# Patient Record
Sex: Male | Born: 1950 | Race: White | Hispanic: No | Marital: Single | Smoking: Never smoker
Health system: Southern US, Community
[De-identification: ages and names within clinical notes are randomized; demographics above are authoritative.]

## PROBLEM LIST (undated history)

## (undated) DIAGNOSIS — I2510 Atherosclerotic heart disease of native coronary artery without angina pectoris: Secondary | ICD-10-CM

## (undated) DIAGNOSIS — N200 Calculus of kidney: Secondary | ICD-10-CM

---

## 2017-08-18 ENCOUNTER — Emergency Department (HOSPITAL_COMMUNITY)
Admission: EM | Admit: 2017-08-18 | Discharge: 2017-08-18 | Disposition: A | Discharge status: Home / Self Care | Payer: Medicare HMO | Attending: Physician Assistant | Admitting: Physician Assistant

## 2017-08-18 ENCOUNTER — Emergency Department (HOSPITAL_COMMUNITY): Payer: Medicare HMO

## 2017-08-18 ENCOUNTER — Encounter (HOSPITAL_COMMUNITY): Payer: Self-pay

## 2017-08-18 DIAGNOSIS — R1032 Left lower quadrant pain: Secondary | ICD-10-CM | POA: Diagnosis present

## 2017-08-18 DIAGNOSIS — Z7982 Long term (current) use of aspirin: Secondary | ICD-10-CM | POA: Diagnosis not present

## 2017-08-18 DIAGNOSIS — I2510 Atherosclerotic heart disease of native coronary artery without angina pectoris: Secondary | ICD-10-CM | POA: Diagnosis not present

## 2017-08-18 DIAGNOSIS — N200 Calculus of kidney: Secondary | ICD-10-CM | POA: Diagnosis not present

## 2017-08-18 DIAGNOSIS — Z79899 Other long term (current) drug therapy: Secondary | ICD-10-CM | POA: Insufficient documentation

## 2017-08-18 HISTORY — DX: Atherosclerotic heart disease of native coronary artery without angina pectoris: I25.10

## 2017-08-18 HISTORY — DX: Calculus of kidney: N20.0

## 2017-08-18 LAB — COMPREHENSIVE METABOLIC PANEL
ALBUMIN: 3.9 g/dL (ref 3.5–5.0)
ALK PHOS: 79 U/L (ref 38–126)
ALT: 49 U/L (ref 17–63)
AST: 30 U/L (ref 15–41)
Anion gap: 7 (ref 5–15)
BILIRUBIN TOTAL: 1.1 mg/dL (ref 0.3–1.2)
BUN: 12 mg/dL (ref 6–20)
CALCIUM: 9.4 mg/dL (ref 8.9–10.3)
CO2: 26 mmol/L (ref 22–32)
CREATININE: 1.78 mg/dL — AB (ref 0.61–1.24)
Chloride: 105 mmol/L (ref 101–111)
GFR calc Af Amer: 44 mL/min — ABNORMAL LOW (ref >60)
GFR calc non Af Amer: 38 mL/min — ABNORMAL LOW (ref >60)
GLUCOSE: 95 mg/dL (ref 65–99)
Potassium: 4 mmol/L (ref 3.5–5.1)
Sodium: 138 mmol/L (ref 135–145)
TOTAL PROTEIN: 6.8 g/dL (ref 6.5–8.1)

## 2017-08-18 LAB — URINALYSIS, ROUTINE W REFLEX MICROSCOPIC
Bilirubin Urine: NEGATIVE
Glucose, UA: NEGATIVE mg/dL
Hgb urine dipstick: NEGATIVE
Ketones, ur: NEGATIVE mg/dL
Leukocytes, UA: NEGATIVE
Nitrite: NEGATIVE
PH: 5 (ref 5.0–8.0)
Protein, ur: NEGATIVE mg/dL
SPECIFIC GRAVITY, URINE: 1.014 (ref 1.005–1.030)

## 2017-08-18 LAB — CBC WITH DIFFERENTIAL/PLATELET
BASOS ABS: 0 10*3/uL (ref 0.0–0.1)
BASOS PCT: 0 %
EOS PCT: 0 %
Eosinophils Absolute: 0 10*3/uL (ref 0.0–0.7)
HCT: 49.2 % (ref 39.0–52.0)
Hemoglobin: 16.5 g/dL (ref 13.0–17.0)
LYMPHS PCT: 8 %
Lymphs Abs: 0.9 10*3/uL (ref 0.7–4.0)
MCH: 30.2 pg (ref 26.0–34.0)
MCHC: 33.5 g/dL (ref 30.0–36.0)
MCV: 89.9 fL (ref 78.0–100.0)
MONO ABS: 1.1 10*3/uL — AB (ref 0.1–1.0)
Monocytes Relative: 9 %
Neutro Abs: 10.1 10*3/uL — ABNORMAL HIGH (ref 1.7–7.7)
Neutrophils Relative %: 83 %
PLATELETS: 183 10*3/uL (ref 150–400)
RBC: 5.47 MIL/uL (ref 4.22–5.81)
RDW: 13 % (ref 11.5–15.5)
WBC: 12.2 10*3/uL — ABNORMAL HIGH (ref 4.0–10.5)

## 2017-08-18 MED ORDER — TAMSULOSIN HCL 0.4 MG PO CAPS
0.4000 mg | ORAL_CAPSULE | Freq: Every day | ORAL | 0 refills | Status: AC
Start: 2017-08-18 — End: ?

## 2017-08-18 MED ORDER — SODIUM CHLORIDE 0.9 % IV BOLUS (SEPSIS)
1000.0000 mL | Freq: Once | INTRAVENOUS | Status: AC
  Administered 2017-08-18: 1000 mL via INTRAVENOUS

## 2017-08-18 MED ORDER — ONDANSETRON 4 MG PO TBDP: 4.0000 mg | ORAL_TABLET | Freq: Three times a day (TID) | ORAL | 0 refills | Status: AC | PRN

## 2017-08-18 MED ORDER — ONDANSETRON HCL 4 MG/2ML IJ SOLN
4.0000 mg | Freq: Once | INTRAMUSCULAR | Status: AC
  Administered 2017-08-18: 4 mg via INTRAVENOUS
  Filled 2017-08-18: qty 2

## 2017-08-18 MED ORDER — IOPAMIDOL (ISOVUE-300) INJECTION 61%
INTRAVENOUS | Status: AC
  Filled 2017-08-18: qty 75

## 2017-08-18 MED ORDER — KETOROLAC TROMETHAMINE 30 MG/ML IJ SOLN
30.0000 mg | Freq: Once | INTRAMUSCULAR | Status: AC
  Administered 2017-08-18: 30 mg via INTRAVENOUS
  Filled 2017-08-18: qty 1

## 2017-08-18 MED ORDER — OXYCODONE-ACETAMINOPHEN 5-325 MG PO TABS: 1.0000 | ORAL_TABLET | Freq: Four times a day (QID) | ORAL | 0 refills | Status: AC | PRN

## 2017-08-18 NOTE — ED Notes (Signed)
Pt comfortable pt denies the need for pain meds.

## 2017-08-18 NOTE — ED Provider Notes (Signed)
[facility] DEPT Provider Note   CSN: [id number] Arrival date & time: 08/18/17  7341     History   Chief Complaint No chief complaint on file.   HPI Koichi Platte. is a 66 y.o. male.  HPI   66 year old male presenting with left flank pain radiating to his groin. Patient has history of kidney stones. Had a lithotripsy in 1993. Patient had additional stones past 2015. Patient presented pain started on Saturday. He reports the pain fluctuates. He's had no fevers nausea vomiting. Patient has chronic difficulty urinating, no change today.    Past Medical History:  Diagnosis Date  . Coronary artery disease   . Kidney stones     There are no active problems to display for this patient.   History reviewed. No pertinent surgical history.     Home Medications    Prior to Admission medications   Medication Sig Start Date End Date Taking? Authorizing Provider  aspirin 325 MG tablet Take 325 mg by mouth daily.   Yes [provider]  atorvastatin (LIPITOR) 40 MG tablet Take 40 mg by mouth daily.   Yes [provider]  cyclobenzaprine (FLEXERIL) 10 MG tablet Take 10 mg by mouth daily as needed for muscle spasms.  08/09/17  Yes [provider]  losartan (COZAAR) 25 MG tablet Take 25 mg by mouth daily. 06/14/17  Yes [provider]  metoprolol succinate (TOPROL-XL) 50 MG 24 hr tablet Take 50 mg by mouth daily. 05/29/17  Yes [provider]    Family History No family history on file.  Social History Social History  Substance Use Topics  . Smoking status: Never Smoker  . Smokeless tobacco: Never Used  . Alcohol use Not on file     Allergies   Keflet [cephalexin]   Review of Systems Review of Systems  Constitutional: Negative for activity change.  Respiratory: Negative for shortness of breath.   Cardiovascular: Negative for chest pain.  Gastrointestinal: Negative for abdominal pain.  Genitourinary: Positive for difficulty  urinating and flank pain. Negative for dysuria.     Physical Exam Updated Vital Signs BP (!) 130/106 (BP Location: Right Arm)   Pulse 70   Temp 98.5 F (36.9 C) (Oral)   Resp 20   SpO2 96%   Physical Exam  Constitutional: He is oriented to person, place, and time. He appears well-nourished.  HENT:  Head: Normocephalic.  Eyes: Conjunctivae are normal.  Cardiovascular: Normal rate.   Pulmonary/Chest: Effort normal. No respiratory distress.  Abdominal: Soft. Bowel sounds are normal. He exhibits no distension. There is no tenderness.  Neurological: He is oriented to person, place, and time.  Skin: Skin is warm and dry. He is not diaphoretic.  Psychiatric: He has a normal mood and affect. His behavior is normal.     ED Treatments / Results  Labs (all labs ordered are listed, but only abnormal results are displayed) Labs Reviewed  COMPREHENSIVE METABOLIC PANEL - Abnormal; Notable for the following:       Result Value   Creatinine, Ser 1.78 (*)    GFR calc non Af Amer 38 (*)    GFR calc Af Amer 44 (*)    All other components within normal limits  CBC WITH DIFFERENTIAL/PLATELET - Abnormal; Notable for the following:    WBC 12.2 (*)    Neutro Abs 10.1 (*)    Monocytes Absolute 1.1 (*)    All other components within normal limits  URINALYSIS, ROUTINE W REFLEX MICROSCOPIC  EKG  EKG Interpretation None       Radiology Ct Renal Stone Study  Result Date: 08/18/2017 CLINICAL DATA:  66 year old male with left flank and groin pain EXAM: CT ABDOMEN AND PELVIS WITHOUT CONTRAST TECHNIQUE: Multidetector CT imaging of the abdomen and pelvis was performed following the standard protocol without IV contrast. COMPARISON:  None. FINDINGS: Lower chest: The lung bases are clear. Visualized cardiac structures are within normal limits for size. Sub endocardial hypoattenuation along the inferior and septal walls of the left ventricle consistent with fibrofatty remodeling in the setting of  prior myocardial infarction. No pericardial effusion. Unremarkable visualized distal thoracic esophagus. Hepatobiliary: Circumscribed low-attenuation lesion in the hepatic dome measures 1.1 cm in demonstrates internal water attenuation most consistent with simple cysts. A second intermediate attenuation lesion in hepatic segment 6/5 measures 1.9 cm in diameter and is incompletely evaluated in the absence of intravenous contrast. Gallbladder is unremarkable. No intra or extrahepatic biliary ductal dilatation. Pancreas: Unremarkable. No pancreatic ductal dilatation or surrounding inflammatory changes. Spleen: Normal in size without focal abnormality. Adrenals/Urinary Tract: Normal adrenal glands. Nonspecific perinephric stranding bilaterally. Mild fullness of the left renal collecting system consistent with mild hydronephrosis. On the coronal reformatted images, there is a suggestion of a tiny calculus within the mid left ureter. Atypical configuration of the right renal pelvis with a 4.3 x 4.0 cm cystic structure centrally. This could represent a parapelvic renal cyst versus dilatation of the renal pelvis from UPJ obstruction. A large parapelvic renal sinus cyst is favored. Stomach/Bowel: No focal bowel wall thickening or evidence obstruction. Colonic diverticular disease without CT evidence of active inflammation. Vascular/Lymphatic: Limited evaluation in the absence of intravenous contrast. No aneurysm. Scattered atherosclerotic vascular calcifications including along the abdominal aorta. Reproductive: Prostate is unremarkable. Other: Small fat containing umbilical hernia. Musculoskeletal: No acute fracture or malalignment. Compression deformity of the T12 vertebral body with approximately 50% height loss anteriorly. No evidence of acute fracture line. Mild L5-S1 degenerative disc disease. IMPRESSION: 1. Mild left-sided hydronephrosis and associated perinephric stranding. There is a suggestion of a possible tiny  punctate calcification within the left mid to distal ureter which may represent a passing stone. 2. 4.3 cm low-attenuation cystic structure in the region of the right renal pelvis is favored to represent a benign parapelvic renal sinus cysts. However, this structure is incompletely evaluated in the absence of intravenous contrast and a focally dilated renal pelvis in the setting of the UPJ obstruction is difficult to exclude entirely. Recommend further evaluation with CT scan of the abdomen with intravenous contrast (renal protocol). 3. Indeterminate 1.9 cm low-attenuation lesion in hepatic segment 5/6 likely represents a benign or minimally complex cyst, but is also incompletely evaluated in the absence of intravenous contrast. This lesion could also be further evaluated on the contrasted CT scan recommended above. 4. Sub endocardial fibrofatty remodeling along the left ventricular septal and inferior wall consistent with prior myocardial infarction. 5.  Aortic Atherosclerosis (ICD10-170.0) 6. Remote T12 compression fracture with approximately 50% height loss. Electronically Signed   By: Jacqulynn Cadet M.D.   On: 08/18/2017 10:34    Procedures Procedures (including critical care time)  Medications Ordered in ED Medications  sodium chloride 0.9 % bolus 1,000 mL (1,000 mLs Intravenous New Bag/Given 08/18/17 0936)  ketorolac (TORADOL) 30 MG/ML injection 30 mg (30 mg Intravenous Given 08/18/17 0936)  ondansetron (ZOFRAN) injection 4 mg (4 mg Intravenous Given 08/18/17 0936)     Initial Impression / Assessment and Plan / ED Course  I  have reviewed the triage vital signs and the nursing notes.  Pertinent labs & imaging results that were available during my care of the patient were reviewed by me and considered in my medical decision making (see chart for details).    66 year old male presenting with left flank pain radiating to his groin. Patient has history of kidney stones. Had a lithotripsy in 1993.  Patient had additional stones past 2015. Patient presented pain started on Saturday. He reports the pain fluctuates. He's had no fevers nausea vomiting. Patient has chronic difficulty urinating, no change today.  11:11 AM Suspect the patient has kidney stone. However urine shows no blood in the urine Given his age, and other risk factors, we'll do CT stone to confirm and get an idea of size in case of need of lithotripsy.  11:11 AM CT shows tiny calculus on the left with hydro., Cr at 1.78 and findings on the right. Disucssed wth [facility] who recommends usual stone treatement and they will see him this week for repeat labs, and evaluation of R kindey as outpatient.   Final Clinical Impressions(s) / ED Diagnoses   Final diagnoses:  None    New Prescriptions New Prescriptions   No medications on file     Macarthur Critchley, MD 08/18/17 1111

## 2017-08-18 NOTE — Discharge Instructions (Signed)
Please follow-up on Monday for repeat labs and potentially repeat imaging this week with [facility].

## 2017-08-18 NOTE — ED Triage Notes (Signed)
Patient complains of left flank and groin pain since early am yesterday. Reports its another kidney stone, hx of same. No vomiting

## 2019-05-02 IMAGING — CT CT RENAL STONE PROTOCOL
2 of 4 series · 15 of 46 positions shown, 17 images · non-contrast
Comparison: None.

CLINICAL DATA: 66-year-old male with left flank and groin pain

EXAM:
CT ABDOMEN AND PELVIS WITHOUT CONTRAST
TECHNIQUE: Multidetector CT imaging of the abdomen and pelvis was performed
following the standard protocol without IV contrast.

[Series 3: ap without · axial · non-contrast · 0.77mm/px · z∈[+908,+1314]mm · 12 of 91 slices shown, 14 images]
[im 5/91  soft-tissue]
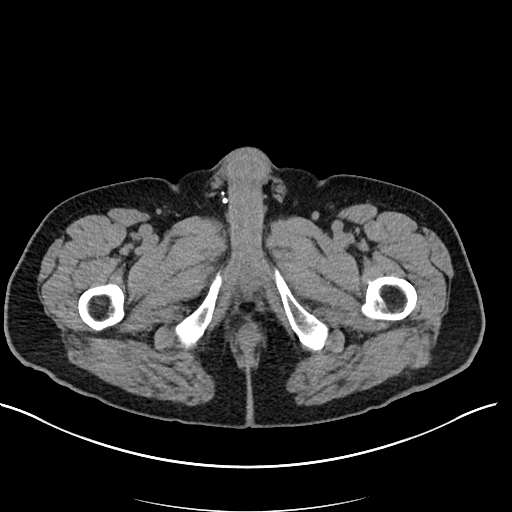
[im 5/91  bone]
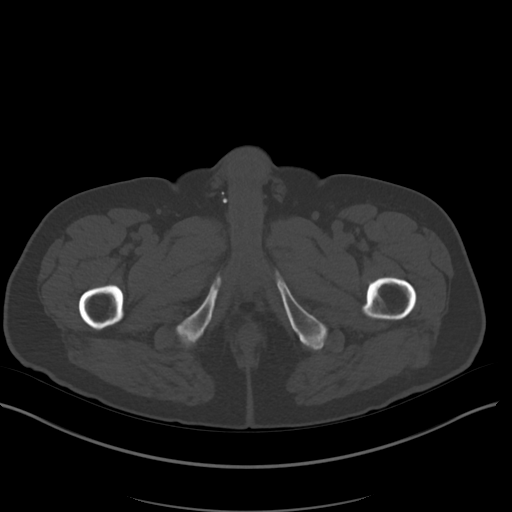
[im 13/91  soft-tissue]
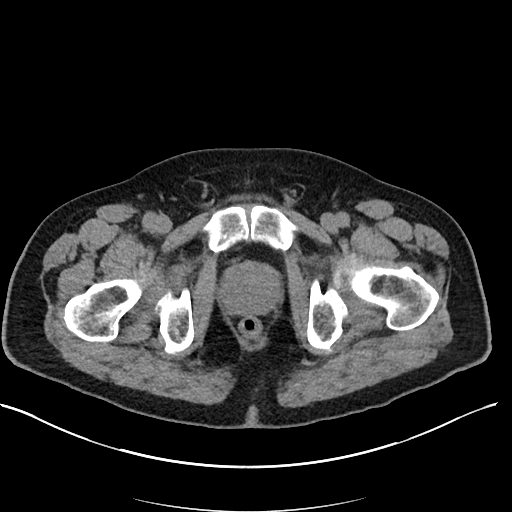
[im 22/91  soft-tissue]
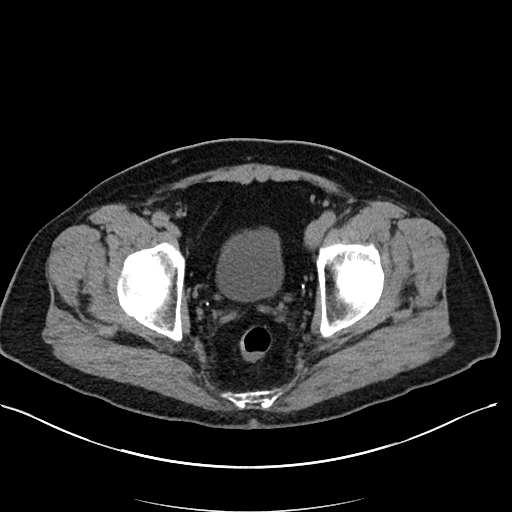
[im 26/91  soft-tissue]
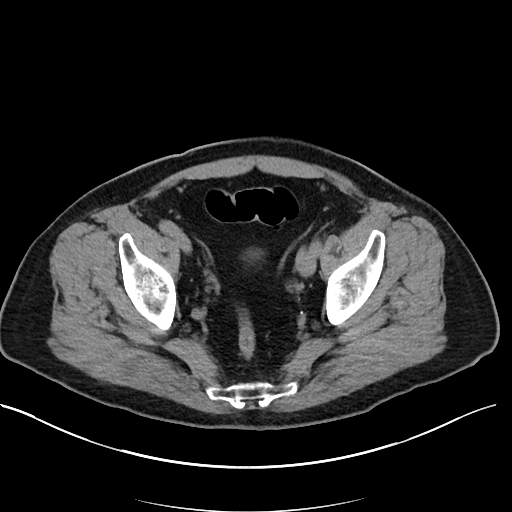
[im 35/91  soft-tissue]
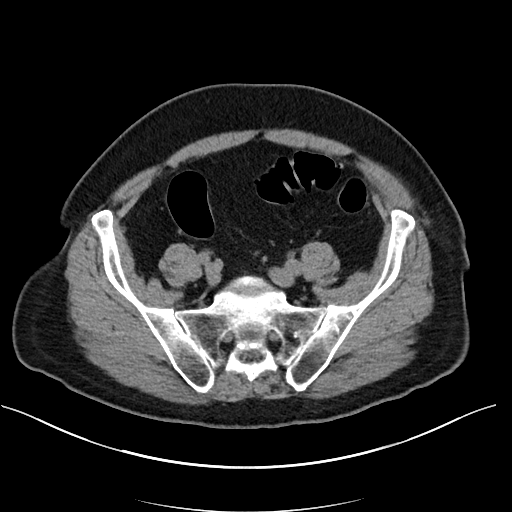
[im 43/91  soft-tissue]
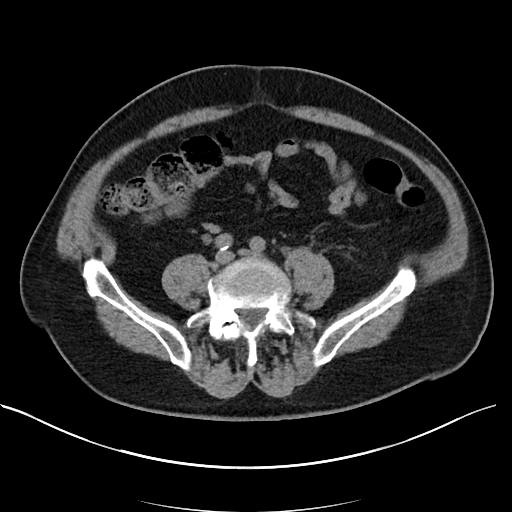
[im 48/91  soft-tissue]
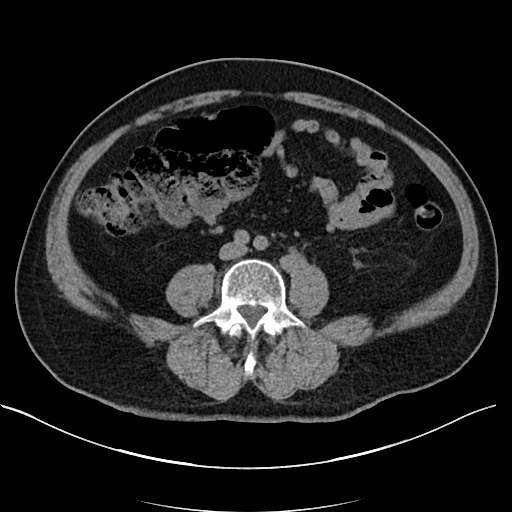
[im 56/91  soft-tissue]
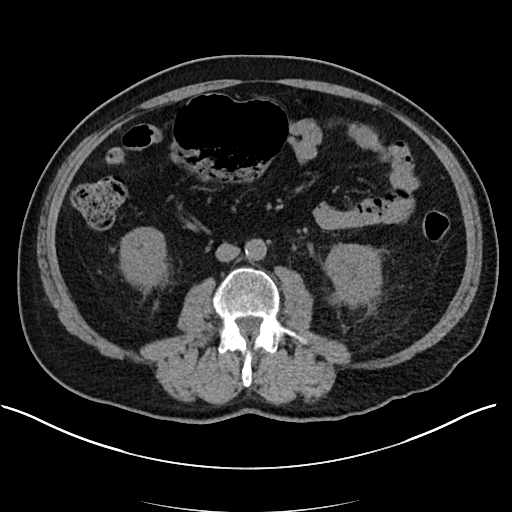
[im 65/91  soft-tissue]
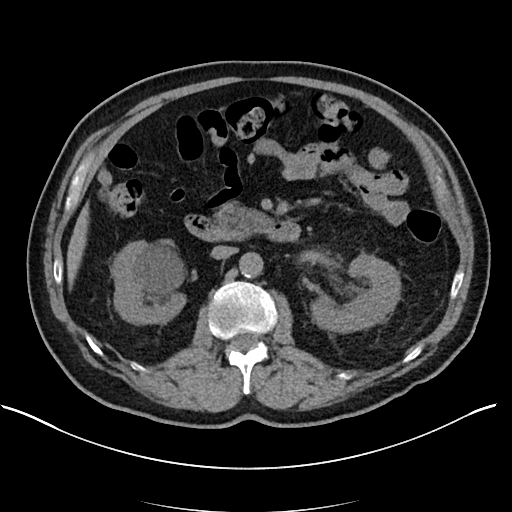
[im 65/91  bone]
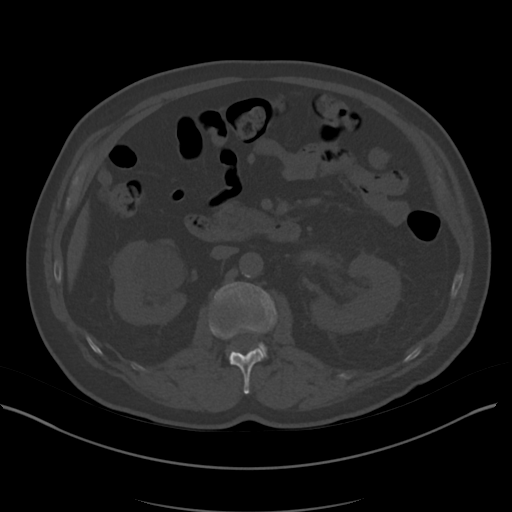
[im 69/91  soft-tissue]
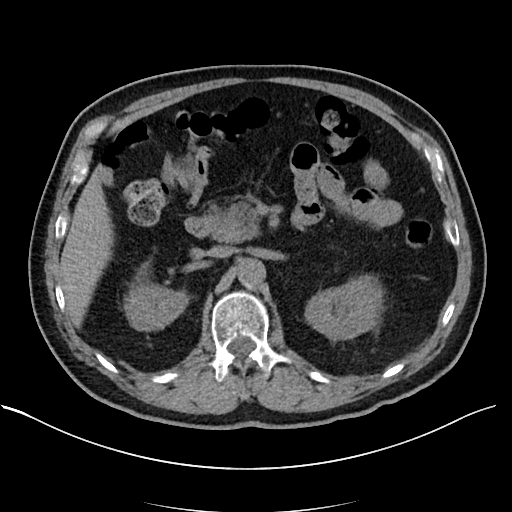
[im 78/91  soft-tissue]
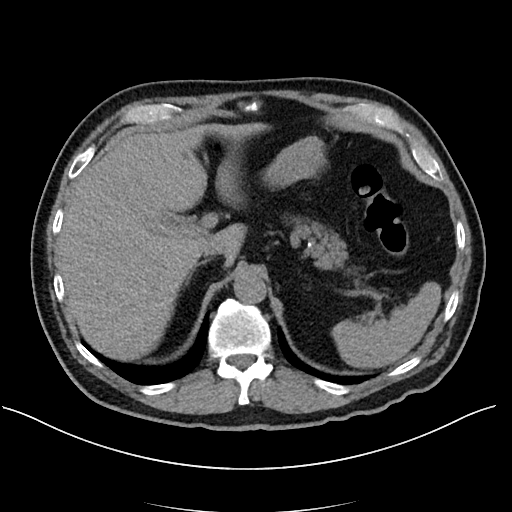
[im 86/91  soft-tissue]
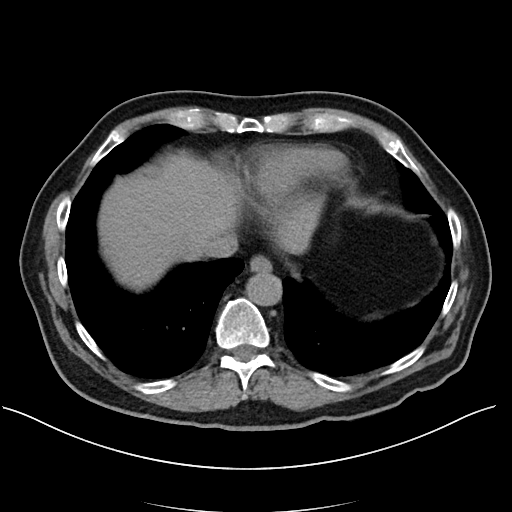

[Series 6: cor · coronal · 0.82mm/px · 3 of 119 slices shown]
[im 40/119  soft-tissue]
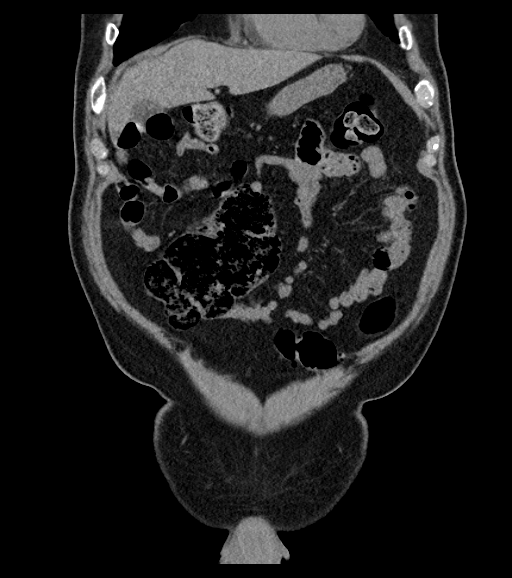
[im 53/119  soft-tissue]
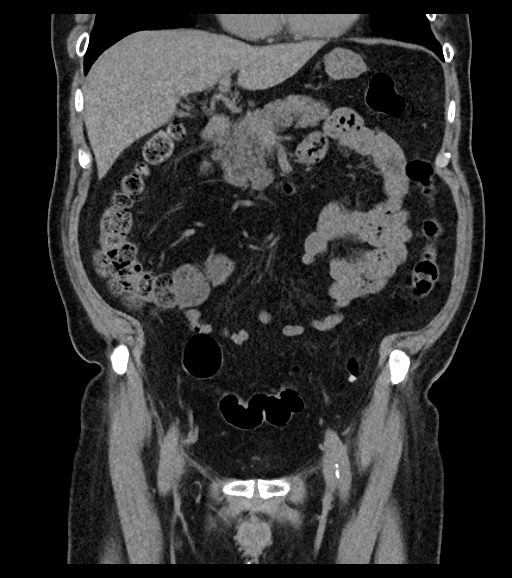
[im 66/119  soft-tissue]
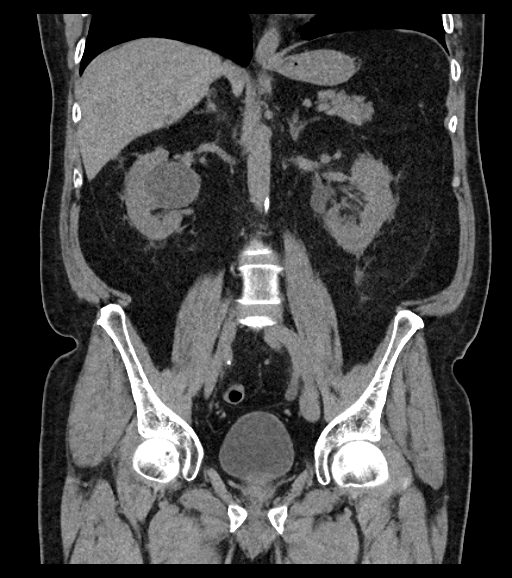

[15 of 46 positions shown; findings below may reference images not displayed]

FINDINGS: Lower chest: The lung bases are clear. Visualized cardiac structures
are within normal limits for size. Sub endocardial hypoattenuation
along the inferior and septal walls of the left ventricle consistent
with fibrofatty remodeling in the setting of prior myocardial
infarction. No pericardial effusion. Unremarkable visualized distal
thoracic esophagus.

Hepatobiliary: Circumscribed low-attenuation lesion in the hepatic
dome measures 1.1 cm in demonstrates internal water attenuation most
consistent with simple cysts. A second intermediate attenuation
lesion in hepatic segment [DATE] measures 1.9 cm in diameter and is
incompletely evaluated in the absence of intravenous contrast.
Gallbladder is unremarkable. No intra or extrahepatic biliary ductal
dilatation.

Pancreas: Unremarkable. No pancreatic ductal dilatation or
surrounding inflammatory changes.

Spleen: Normal in size without focal abnormality.

Adrenals/Urinary Tract: Normal adrenal glands. Nonspecific
perinephric stranding bilaterally. Mild fullness of the left renal
collecting system consistent with mild hydronephrosis. On the
coronal reformatted images, there is a suggestion of a tiny calculus
within the mid left ureter. Atypical configuration of the right
renal pelvis with a 4.3 x 4.0 cm cystic structure centrally. This
could represent a parapelvic renal cyst versus dilatation of the
renal pelvis from UPJ obstruction. A large parapelvic renal sinus
cyst is favored.

Stomach/Bowel: No focal bowel wall thickening or evidence
obstruction. Colonic diverticular disease without CT evidence of
active inflammation.

Vascular/Lymphatic: Limited evaluation in the absence of intravenous
contrast. No aneurysm. Scattered atherosclerotic vascular
calcifications including along the abdominal aorta.

Reproductive: Prostate is unremarkable.

Other: Small fat containing umbilical hernia.

Musculoskeletal: No acute fracture or malalignment. Compression
deformity of the T12 vertebral body with approximately 50% height
loss anteriorly. No evidence of acute fracture line. Mild L5-S1
degenerative disc disease.
IMPRESSION: 1. Mild left-sided hydronephrosis and associated perinephric
stranding. There is a suggestion of a possible tiny punctate
calcification within the left mid to distal ureter which may
represent a passing stone.
2. 4.3 cm low-attenuation cystic structure in the region of the
right renal pelvis is favored to represent a benign parapelvic renal
sinus cysts. However, this structure is incompletely evaluated in
the absence of intravenous contrast and a focally dilated renal
pelvis in the setting of the UPJ obstruction is difficult to exclude
entirely. Recommend further evaluation with CT scan of the abdomen
with intravenous contrast (renal protocol).
3. Indeterminate 1.9 cm low-attenuation lesion in hepatic segment
[DATE] likely represents a benign or minimally complex cyst, but is
also incompletely evaluated in the absence of intravenous contrast.
This lesion could also be further evaluated on the contrasted CT
scan recommended above.
4. Sub endocardial fibrofatty remodeling along the left ventricular
septal and inferior wall consistent with prior myocardial
infarction.
5.  Aortic Atherosclerosis (IR6EP-170.0)
6. Remote T12 compression fracture with approximately 50% height
loss.

## 2019-12-28 ENCOUNTER — Other Ambulatory Visit: Payer: Medicare HMO

## 2020-04-09 ENCOUNTER — Emergency Department (HOSPITAL_COMMUNITY): Payer: Medicare HMO

## 2020-04-09 ENCOUNTER — Other Ambulatory Visit: Payer: Self-pay

## 2020-04-09 ENCOUNTER — Emergency Department (HOSPITAL_COMMUNITY)
Admission: EM | Admit: 2020-04-09 | Discharge: 2020-04-09 | Disposition: A | Discharge status: Home / Self Care | Payer: Medicare HMO | Attending: Emergency Medicine | Admitting: Emergency Medicine

## 2020-04-09 ENCOUNTER — Encounter (HOSPITAL_COMMUNITY): Payer: Self-pay | Admitting: Emergency Medicine

## 2020-04-09 DIAGNOSIS — N281 Cyst of kidney, acquired: Secondary | ICD-10-CM | POA: Insufficient documentation

## 2020-04-09 DIAGNOSIS — K7689 Other specified diseases of liver: Secondary | ICD-10-CM | POA: Diagnosis not present

## 2020-04-09 DIAGNOSIS — I2510 Atherosclerotic heart disease of native coronary artery without angina pectoris: Secondary | ICD-10-CM | POA: Insufficient documentation

## 2020-04-09 DIAGNOSIS — Z79899 Other long term (current) drug therapy: Secondary | ICD-10-CM | POA: Diagnosis not present

## 2020-04-09 DIAGNOSIS — K5730 Diverticulosis of large intestine without perforation or abscess without bleeding: Secondary | ICD-10-CM | POA: Insufficient documentation

## 2020-04-09 DIAGNOSIS — I700 Atherosclerosis of aorta: Secondary | ICD-10-CM | POA: Insufficient documentation

## 2020-04-09 DIAGNOSIS — N130 Hydronephrosis with ureteropelvic junction obstruction: Secondary | ICD-10-CM | POA: Diagnosis not present

## 2020-04-09 DIAGNOSIS — R110 Nausea: Secondary | ICD-10-CM

## 2020-04-09 DIAGNOSIS — M4854XA Collapsed vertebra, not elsewhere classified, thoracic region, initial encounter for fracture: Secondary | ICD-10-CM | POA: Diagnosis not present

## 2020-04-09 DIAGNOSIS — Z955 Presence of coronary angioplasty implant and graft: Secondary | ICD-10-CM | POA: Insufficient documentation

## 2020-04-09 DIAGNOSIS — R109 Unspecified abdominal pain: Secondary | ICD-10-CM | POA: Diagnosis present

## 2020-04-09 DIAGNOSIS — N200 Calculus of kidney: Secondary | ICD-10-CM

## 2020-04-09 DIAGNOSIS — R1032 Left lower quadrant pain: Secondary | ICD-10-CM

## 2020-04-09 LAB — URINALYSIS, ROUTINE W REFLEX MICROSCOPIC
Bilirubin Urine: NEGATIVE
Glucose, UA: NEGATIVE mg/dL
Ketones, ur: NEGATIVE mg/dL
Leukocytes,Ua: NEGATIVE
Nitrite: NEGATIVE
Protein, ur: 30 mg/dL — AB
Specific Gravity, Urine: 1.026 (ref 1.005–1.030)
pH: 5 (ref 5.0–8.0)

## 2020-04-09 LAB — CBC WITH DIFFERENTIAL/PLATELET
Abs Immature Granulocytes: 0.04 10*3/uL (ref 0.00–0.07)
Basophils Absolute: 0 10*3/uL (ref 0.0–0.1)
Basophils Relative: 1 %
Eosinophils Absolute: 0.1 10*3/uL (ref 0.0–0.5)
Eosinophils Relative: 2 %
HCT: 52.1 % — ABNORMAL HIGH (ref 39.0–52.0)
Hemoglobin: 16.8 g/dL (ref 13.0–17.0)
Immature Granulocytes: 1 %
Lymphocytes Relative: 15 %
Lymphs Abs: 1.2 10*3/uL (ref 0.7–4.0)
MCH: 29.9 pg (ref 26.0–34.0)
MCHC: 32.2 g/dL (ref 30.0–36.0)
MCV: 92.9 fL (ref 80.0–100.0)
Monocytes Absolute: 0.5 10*3/uL (ref 0.1–1.0)
Monocytes Relative: 6 %
Neutro Abs: 5.7 10*3/uL (ref 1.7–7.7)
Neutrophils Relative %: 75 %
Platelets: 206 10*3/uL (ref 150–400)
RBC: 5.61 MIL/uL (ref 4.22–5.81)
RDW: 12.3 % (ref 11.5–15.5)
WBC: 7.6 10*3/uL (ref 4.0–10.5)
nRBC: 0 % (ref 0.0–0.2)

## 2020-04-09 LAB — COMPREHENSIVE METABOLIC PANEL
ALT: 48 U/L — ABNORMAL HIGH (ref 0–44)
AST: 30 U/L (ref 15–41)
Albumin: 3.9 g/dL (ref 3.5–5.0)
Alkaline Phosphatase: 75 U/L (ref 38–126)
Anion gap: 10 (ref 5–15)
BUN: 16 mg/dL (ref 8–23)
CO2: 23 mmol/L (ref 22–32)
Calcium: 9.2 mg/dL (ref 8.9–10.3)
Chloride: 107 mmol/L (ref 98–111)
Creatinine, Ser: 1.14 mg/dL (ref 0.61–1.24)
GFR calc Af Amer: >60 mL/min (ref >60)
GFR calc non Af Amer: >60 mL/min (ref >60)
Glucose, Bld: 199 mg/dL — ABNORMAL HIGH (ref 70–99)
Potassium: 3.9 mmol/L (ref 3.5–5.1)
Sodium: 140 mmol/L (ref 135–145)
Total Bilirubin: 1 mg/dL (ref 0.3–1.2)
Total Protein: 6.8 g/dL (ref 6.5–8.1)

## 2020-04-09 MED ORDER — TAMSULOSIN HCL 0.4 MG PO CAPS: 0.4000 mg | ORAL_CAPSULE | Freq: Every day | ORAL | 0 refills | Status: AC

## 2020-04-09 MED ORDER — HYDROMORPHONE HCL 1 MG/ML IJ SOLN
1.0000 mg | Freq: Once | INTRAMUSCULAR | Status: AC
  Administered 2020-04-09: 1 mg via INTRAVENOUS
  Filled 2020-04-09: qty 1

## 2020-04-09 MED ORDER — ONDANSETRON HCL 4 MG/2ML IJ SOLN
4.0000 mg | Freq: Once | INTRAMUSCULAR | Status: AC
  Administered 2020-04-09: 4 mg via INTRAVENOUS
  Filled 2020-04-09: qty 2

## 2020-04-09 MED ORDER — OXYCODONE-ACETAMINOPHEN 5-325 MG PO TABS: 1.0000 | ORAL_TABLET | ORAL | 0 refills | Status: AC | PRN

## 2020-04-09 MED ORDER — KETOROLAC TROMETHAMINE 30 MG/ML IJ SOLN
15.0000 mg | Freq: Once | INTRAMUSCULAR | Status: AC
  Administered 2020-04-09: 15 mg via INTRAVENOUS
  Filled 2020-04-09: qty 1

## 2020-04-09 MED ORDER — ONDANSETRON HCL 4 MG PO TABS: 4.0000 mg | ORAL_TABLET | Freq: Three times a day (TID) | ORAL | 0 refills | Status: AC | PRN

## 2020-04-09 NOTE — ED Provider Notes (Signed)
[facility] EMERGENCY DEPARTMENT Provider Note   CSN: [id number] Arrival date & time: 04/09/20  [id number]     History Chief Complaint  Patient presents with  . Abdominal Pain    Flank Pain     Patrick Tanner. is a 69 y.o. male.  The history is provided by the patient and medical records. No language interpreter was used.  Flank Pain This is a recurrent problem. The current episode started 3 to 5 hours ago. The problem occurs constantly. The problem has been gradually worsening. Associated symptoms include abdominal pain. Pertinent negatives include no chest pain, no headaches and no shortness of breath. Nothing aggravates the symptoms. Nothing relieves the symptoms. He has tried nothing for the symptoms. The treatment provided no relief.       Past Medical History:  Diagnosis Date  . Coronary artery disease   . Kidney stones     There are no problems to display for this patient.   History reviewed. No pertinent surgical history.     No family history on file.  Social History   Tobacco Use  . Smoking status: Never Smoker  . Smokeless tobacco: Never Used  Substance Use Topics  . Alcohol use: Never  . Drug use: Never    Home Medications Prior to Admission medications   Medication Sig Start Date End Date Taking? Authorizing Provider  aspirin 325 MG tablet Take 325 mg by mouth daily.    [provider]  atorvastatin (LIPITOR) 40 MG tablet Take 40 mg by mouth daily.    [provider]  cyclobenzaprine (FLEXERIL) 10 MG tablet Take 10 mg by mouth daily as needed for muscle spasms.  08/09/17   [provider]  losartan (COZAAR) 25 MG tablet Take 25 mg by mouth daily. 06/14/17   [provider]  metoprolol succinate (TOPROL-XL) 50 MG 24 hr tablet Take 50 mg by mouth daily. 05/29/17   [provider]  ondansetron (ZOFRAN ODT) 4 MG disintegrating tablet Take 1 tablet (4 mg total) by mouth every 8 (eight) hours as  needed for nausea or vomiting. 08/18/17   Mackuen, Courteney Lyn, MD  oxyCODONE-acetaminophen (PERCOCET/ROXICET) 5-325 MG tablet Take 1 tablet by mouth every 6 (six) hours as needed for severe pain. 08/18/17   Mackuen, Courteney Lyn, MD  tamsulosin (FLOMAX) 0.4 MG CAPS capsule Take 1 capsule (0.4 mg total) by mouth daily. 08/18/17   Mackuen, Fredia Sorrow, MD    Allergies    Keflet [cephalexin]  Review of Systems   Review of Systems  Constitutional: Positive for diaphoresis. Negative for chills, fatigue and fever.  HENT: Negative for congestion and rhinorrhea.   Eyes: Negative for photophobia and visual disturbance.  Respiratory: Negative for chest tightness, shortness of breath, wheezing and stridor.   Cardiovascular: Negative for chest pain, palpitations and leg swelling.  Gastrointestinal: Positive for abdominal pain, diarrhea and nausea. Negative for abdominal distention, constipation and vomiting.  Genitourinary: Positive for flank pain. Negative for decreased urine volume, difficulty urinating, discharge, dysuria, frequency, penile pain, penile swelling, scrotal swelling, testicular pain and urgency.  Musculoskeletal: Positive for back pain. Negative for neck pain and neck stiffness.  Skin: Negative for rash and wound.  Neurological: Negative for light-headedness, numbness and headaches.  Psychiatric/Behavioral: Negative for agitation and confusion.  All other systems reviewed and are negative.   Physical Exam Updated Vital Signs BP (!) 158/88   Pulse 71   Temp 98 F (36.7 C) (Oral)   Resp 20  Ht 5\' 9"  (1.753 m)   Wt 85 kg   SpO2 98%   BMI 27.67 kg/m   Physical Exam Vitals and nursing note reviewed.  Constitutional:      General: He is not in acute distress.    Appearance: He is well-developed. He is not ill-appearing, toxic-appearing or diaphoretic.  HENT:     Head: Normocephalic and atraumatic.     Mouth/Throat:     Mouth: Mucous membranes are moist.     Pharynx: No  pharyngeal swelling or oropharyngeal exudate.  Eyes:     Extraocular Movements: Extraocular movements intact.     Conjunctiva/sclera: Conjunctivae normal.  Cardiovascular:     Rate and Rhythm: Normal rate and regular rhythm.     Heart sounds: No murmur.  Pulmonary:     Effort: Pulmonary effort is normal. No respiratory distress.     Breath sounds: Normal breath sounds. No wheezing, rhonchi or rales.  Chest:     Chest wall: No tenderness.  Abdominal:     General: Abdomen is flat. Bowel sounds are normal. There is no distension.     Palpations: Abdomen is soft.     Tenderness: There is abdominal tenderness in the left lower quadrant. There is left CVA tenderness. There is no right CVA tenderness, guarding or rebound.  Musculoskeletal:     Cervical back: Neck supple.  Skin:    General: Skin is warm and dry.     Capillary Refill: Capillary refill takes less than 2 seconds.     Findings: No rash.  Neurological:     General: No focal deficit present.     Mental Status: He is alert.  Psychiatric:        Mood and Affect: Mood normal.     ED Results / Procedures / Treatments   Labs (all labs ordered are listed, but only abnormal results are displayed) Labs Reviewed  CBC WITH DIFFERENTIAL/PLATELET - Abnormal; Notable for the following components:      Result Value   HCT 52.1 (*)    All other components within normal limits  COMPREHENSIVE METABOLIC PANEL - Abnormal; Notable for the following components:   Glucose, Bld 199 (*)    ALT 48 (*)    All other components within normal limits  URINALYSIS, ROUTINE W REFLEX MICROSCOPIC - Abnormal; Notable for the following components:   APPearance HAZY (*)    Hgb urine dipstick MODERATE (*)    Protein, ur 30 (*)    Bacteria, UA RARE (*)    All other components within normal limits    EKG None  Radiology CT Renal Stone Study  Result Date: 04/09/2020 CLINICAL DATA:  Left flank pain since this morning. History of stones. Hematuria.  EXAM: CT ABDOMEN AND PELVIS WITHOUT CONTRAST TECHNIQUE: Multidetector CT imaging of the abdomen and pelvis was performed following the standard protocol without IV contrast. COMPARISON:  09/05/2017 FINDINGS: Lower chest: Linear atelectasis left base. Minimal calcified plaque over the descending thoracic aorta. Hepatobiliary: There are a few small stable liver cysts. Gallbladder and biliary tree are normal. Pancreas: Normal. Spleen: Normal. Adrenals/Urinary Tract: Adrenal glands are normal. Kidneys are normal size. 4.8 cm right parapelvic renal cyst unchanged. No right-sided hydronephrosis. Right ureter is normal. There is mild left-sided hydronephrosis with mild perinephric inflammation/fluid. There is a punctate stone over the upper pole of the left kidney. There is mild prominence of the left ureter as there is a 2 mm stone at the left UVJ causing this low-grade obstruction. Bladder is  normal. Stomach/Bowel: Stomach and small bowel are normal. Appendix is normal. Minimal diverticulosis of the colon most prominent over the distal descending and sigmoid colon. Vascular/Lymphatic: Mild calcified plaque over the abdominal aorta which is normal in caliber. No evidence of adenopathy. Reproductive: Normal. Other: No free peritoneal fluid. Musculoskeletal: Stable T12 compression fracture. IMPRESSION: 1. Minimal left-sided nephrolithiasis. 2 mm stone at the left UVJ causing low-grade obstruction. 2. 4.8 cm right peripelvic renal cyst unchanged. Several small liver cysts unchanged. 3.  Colonic diverticulosis. 4.  Stable T12 compression fracture. 5.  Aortic Atherosclerosis (ICD10-I70.0). Electronically Signed   By: Marin Olp M.D.   On: 04/09/2020 10:10    Procedures Procedures (including critical care time)  Medications Ordered in ED Medications  ketorolac (TORADOL) 30 MG/ML injection 15 mg (15 mg Intravenous Given 04/09/20 0911)  ondansetron (ZOFRAN) injection 4 mg (4 mg Intravenous Given 04/09/20 0911)   HYDROmorphone (DILAUDID) injection 1 mg (1 mg Intravenous Given 04/09/20 0911)    ED Course  I have reviewed the triage vital signs and the nursing notes.  Pertinent labs & imaging results that were available during my care of the patient were reviewed by me and considered in my medical decision making (see chart for details).    MDM Rules/Calculators/A&P                      Patrick Tanner. is a 70 y.o. male with a past medical history significant for CAD status post PCI and prior kidney stones who presents with left back and flank pain.  Patient reports that his symptoms began around 3 AM this morning.  He reports the pain was initially a 5 out of 10 going to a 9 and then 10 out of 10 where he currently resides.  Report is in his left CVA and flank areas rating around towards his left lower quadrant.  He reports this feels similar to prior stone.  He reports that his first stone in the 90s had to have surgical intervention and subsequent stones have been able to pass.  He reports no dysuria I did not see gross hematuria.  He denies any trauma.  He reports he had been feeling well the last few days and denies recent fevers, chills, cough, congestion, or respiratory symptoms.  He reports no constipation but had a mildly loose stool without bleeding.  He denies any history of diverticulitis or aortic problems.  He denies any other abdominal pain or chest pains.  He denies any leg symptoms.  He denies any groin or scrotal symptoms.  On exam, patient has tenderness in the left CVA and left flank area.  No overall abdominal tenderness.  Mild left lower quadrant tenderness.  Normal bowel sounds.  Lungs clear and chest nontender.  Good pulses in extremities with normal sensation and strength in legs.  Clinically I suspect recurrent stone.  Patient had labs in triage confirming blood in the urine but no nitrites or leukocytes.  Doubt UTI or infected stone if it is there.  Will get a CT scan given his  history of stone requiring surgical intervention and will also give the patient pain medicine and nausea medicine.  His other labs did not show acute kidney injury and had a normal white blood cell count.  Anticipate reassessment after imaging and medication administration.  CT scan confirmed a left-sided 2 mm UVJ stone consistent with his symptoms.  No evidence of infected stone.  No AKI.  Patient is already  feeling better, do feel he is safe for discharge home.  He will be given prescription for pain medicine, nausea medicine, and Flomax and will follow up with his urologist.  He understands return precautions and was able to eat and drink safely.  We feel he is safe for maintaining hydration and will be discharged home.   Final Clinical Impression(s) / ED Diagnoses Final diagnoses:  Kidney stone on left side  Left lower quadrant abdominal pain  Nausea    Rx / DC Orders ED Discharge Orders         Ordered    ondansetron (ZOFRAN) 4 MG tablet  Every 8 hours PRN     04/09/20 1151    oxyCODONE-acetaminophen (PERCOCET/ROXICET) 5-325 MG tablet  Every 4 hours PRN     04/09/20 1151    tamsulosin (FLOMAX) 0.4 MG CAPS capsule  Daily     04/09/20 1151         Clinical Impression: 1. Kidney stone on left side   2. Left lower quadrant abdominal pain   3. Nausea     Disposition: Discharge  Condition: Good  I have discussed the results, Dx and Tx plan with the pt(& family if present). He/she/they expressed understanding and agree(s) with the plan. Discharge instructions discussed at great length. Strict return precautions discussed and pt &/or family have verbalized understanding of the instructions. No further questions at time of discharge.    Discharge Medication List as of 04/09/2020 11:53 AM    START taking these medications   Details  ondansetron (ZOFRAN) 4 MG tablet Take 1 tablet (4 mg total) by mouth every 8 (eight) hours as needed for nausea or vomiting., Starting Sat 04/09/2020,  Normal    !! oxyCODONE-acetaminophen (PERCOCET/ROXICET) 5-325 MG tablet Take 1 tablet by mouth every 4 (four) hours as needed for severe pain., Starting Sat 04/09/2020, Normal    !! tamsulosin (FLOMAX) 0.4 MG CAPS capsule Take 1 capsule (0.4 mg total) by mouth daily., Starting Sat 04/09/2020, Normal     !! - Potential duplicate medications found. Please discuss with provider.      Follow Up: Neila Gear, MD [address] [place] [address] [phone]     [place] [id number] [facility] [place]       Tequan Redmon, Gwenyth Allegra, MD 04/09/20 2017

## 2020-04-09 NOTE — ED Notes (Signed)
Patient stated no issues with drinking water, able to keep things down

## 2020-04-09 NOTE — ED Triage Notes (Signed)
Patient reports LLQ abdominal pain radiating to left flank this morning , denies hematuria , no emesis or diarrhea , patient suspects kidney stone flare up . No fever or chills .

## 2020-04-09 NOTE — Discharge Instructions (Addendum)
Your imaging today confirmed a small, 2 mm kidney stone in the left UVJ which is right before your bladder.  I suspect you will pass it soon if you have not passed it already.  Please use the pain medicine, nausea med, and Flomax as he previously had for the stone if the symptoms persist.  Please follow-up with your urologist and PCP.  We did not see evidence of kidney injury or infected stone today.  Given your improved symptoms, we feel you are safe for discharge home.  Please stay hydrated and rest and follow-up.  If any symptoms change or worsen acutely, please return to the nearest emergency department.

## 2021-01-12 DIAGNOSIS — C44329 Squamous cell carcinoma of skin of other parts of face: Secondary | ICD-10-CM | POA: Diagnosis not present

## 2021-02-02 DIAGNOSIS — K625 Hemorrhage of anus and rectum: Secondary | ICD-10-CM | POA: Diagnosis not present

## 2021-02-14 DIAGNOSIS — K921 Melena: Secondary | ICD-10-CM | POA: Diagnosis not present

## 2021-02-28 DIAGNOSIS — Z86010 Personal history of colonic polyps: Secondary | ICD-10-CM | POA: Diagnosis not present

## 2021-02-28 DIAGNOSIS — K921 Melena: Secondary | ICD-10-CM | POA: Diagnosis not present

## 2021-02-28 DIAGNOSIS — K6389 Other specified diseases of intestine: Secondary | ICD-10-CM | POA: Diagnosis not present

## 2021-02-28 DIAGNOSIS — K523 Indeterminate colitis: Secondary | ICD-10-CM | POA: Diagnosis not present

## 2021-02-28 DIAGNOSIS — K5150 Left sided colitis without complications: Secondary | ICD-10-CM | POA: Diagnosis not present

## 2021-03-09 DIAGNOSIS — K5730 Diverticulosis of large intestine without perforation or abscess without bleeding: Secondary | ICD-10-CM | POA: Diagnosis not present

## 2021-03-09 DIAGNOSIS — K921 Melena: Secondary | ICD-10-CM | POA: Diagnosis not present

## 2021-03-09 DIAGNOSIS — K523 Indeterminate colitis: Secondary | ICD-10-CM | POA: Diagnosis not present

## 2021-03-09 DIAGNOSIS — N400 Enlarged prostate without lower urinary tract symptoms: Secondary | ICD-10-CM | POA: Diagnosis not present

## 2021-03-09 DIAGNOSIS — K6389 Other specified diseases of intestine: Secondary | ICD-10-CM | POA: Diagnosis not present

## 2021-03-20 DIAGNOSIS — E785 Hyperlipidemia, unspecified: Secondary | ICD-10-CM | POA: Diagnosis not present

## 2021-03-20 DIAGNOSIS — G479 Sleep disorder, unspecified: Secondary | ICD-10-CM | POA: Diagnosis not present

## 2021-03-20 DIAGNOSIS — I2510 Atherosclerotic heart disease of native coronary artery without angina pectoris: Secondary | ICD-10-CM | POA: Diagnosis not present

## 2021-03-20 DIAGNOSIS — R351 Nocturia: Secondary | ICD-10-CM | POA: Diagnosis not present

## 2021-03-20 DIAGNOSIS — K529 Noninfective gastroenteritis and colitis, unspecified: Secondary | ICD-10-CM | POA: Diagnosis not present

## 2021-06-19 DIAGNOSIS — K523 Indeterminate colitis: Secondary | ICD-10-CM | POA: Diagnosis not present

## 2021-06-23 DIAGNOSIS — E785 Hyperlipidemia, unspecified: Secondary | ICD-10-CM | POA: Diagnosis not present

## 2021-06-23 DIAGNOSIS — U071 COVID-19: Secondary | ICD-10-CM | POA: Diagnosis not present

## 2021-06-23 DIAGNOSIS — R350 Frequency of micturition: Secondary | ICD-10-CM | POA: Diagnosis not present

## 2021-07-03 DIAGNOSIS — U071 COVID-19: Secondary | ICD-10-CM | POA: Diagnosis not present

## 2021-07-07 DIAGNOSIS — U071 COVID-19: Secondary | ICD-10-CM | POA: Diagnosis not present

## 2021-07-07 DIAGNOSIS — Z20822 Contact with and (suspected) exposure to covid-19: Secondary | ICD-10-CM | POA: Diagnosis not present

## 2021-08-03 DIAGNOSIS — Z0000 Encounter for general adult medical examination without abnormal findings: Secondary | ICD-10-CM | POA: Diagnosis not present

## 2021-08-03 DIAGNOSIS — I2101 ST elevation (STEMI) myocardial infarction involving left main coronary artery: Secondary | ICD-10-CM | POA: Diagnosis not present

## 2021-08-03 DIAGNOSIS — N401 Enlarged prostate with lower urinary tract symptoms: Secondary | ICD-10-CM | POA: Diagnosis not present

## 2021-08-03 DIAGNOSIS — I2510 Atherosclerotic heart disease of native coronary artery without angina pectoris: Secondary | ICD-10-CM | POA: Diagnosis not present

## 2021-08-03 DIAGNOSIS — Z125 Encounter for screening for malignant neoplasm of prostate: Secondary | ICD-10-CM | POA: Diagnosis not present

## 2021-08-03 DIAGNOSIS — E785 Hyperlipidemia, unspecified: Secondary | ICD-10-CM | POA: Diagnosis not present

## 2021-08-03 DIAGNOSIS — K529 Noninfective gastroenteritis and colitis, unspecified: Secondary | ICD-10-CM | POA: Diagnosis not present

## 2021-08-03 DIAGNOSIS — R351 Nocturia: Secondary | ICD-10-CM | POA: Diagnosis not present

## 2021-08-23 DIAGNOSIS — K523 Indeterminate colitis: Secondary | ICD-10-CM | POA: Diagnosis not present

## 2021-10-10 DIAGNOSIS — K523 Indeterminate colitis: Secondary | ICD-10-CM | POA: Diagnosis not present

## 2021-11-09 DIAGNOSIS — H5203 Hypermetropia, bilateral: Secondary | ICD-10-CM | POA: Diagnosis not present

## 2021-11-22 DIAGNOSIS — D225 Melanocytic nevi of trunk: Secondary | ICD-10-CM | POA: Diagnosis not present

## 2021-11-22 DIAGNOSIS — Z23 Encounter for immunization: Secondary | ICD-10-CM | POA: Diagnosis not present

## 2021-11-22 DIAGNOSIS — L219 Seborrheic dermatitis, unspecified: Secondary | ICD-10-CM | POA: Diagnosis not present

## 2021-11-22 DIAGNOSIS — L821 Other seborrheic keratosis: Secondary | ICD-10-CM | POA: Diagnosis not present

## 2021-11-22 DIAGNOSIS — L578 Other skin changes due to chronic exposure to nonionizing radiation: Secondary | ICD-10-CM | POA: Diagnosis not present

## 2021-11-22 DIAGNOSIS — L570 Actinic keratosis: Secondary | ICD-10-CM | POA: Diagnosis not present

## 2021-11-22 DIAGNOSIS — C44219 Basal cell carcinoma of skin of left ear and external auricular canal: Secondary | ICD-10-CM | POA: Diagnosis not present

## 2021-11-22 DIAGNOSIS — D485 Neoplasm of uncertain behavior of skin: Secondary | ICD-10-CM | POA: Diagnosis not present

## 2021-11-22 DIAGNOSIS — L820 Inflamed seborrheic keratosis: Secondary | ICD-10-CM | POA: Diagnosis not present

## 2021-12-22 IMAGING — CT CT RENAL STONE PROTOCOL
2 of 4 series · 16 of 46 positions shown, 18 images · non-contrast
Comparison: 09/05/2017

CLINICAL DATA: Left flank pain since this morning. History of
stones. Hematuria.

EXAM:
CT ABDOMEN AND PELVIS WITHOUT CONTRAST
TECHNIQUE: Multidetector CT imaging of the abdomen and pelvis was performed
following the standard protocol without IV contrast.

[Series 3: renal stone 5.0 · axial · 0.82mm/px · z∈[-468,-28]mm · 13 of 97 slices shown, 15 images]
[im 5/97  soft-tissue]
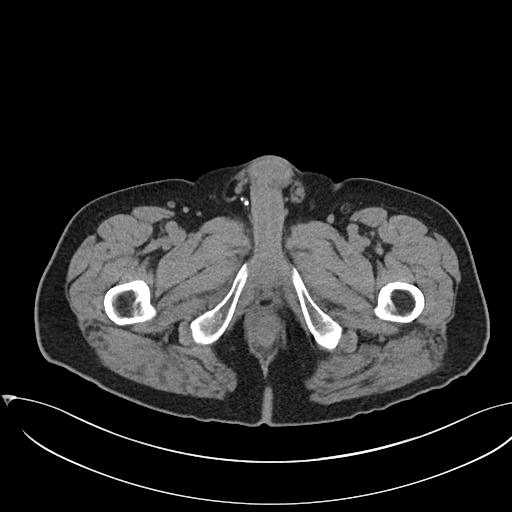
[im 5/97  bone]
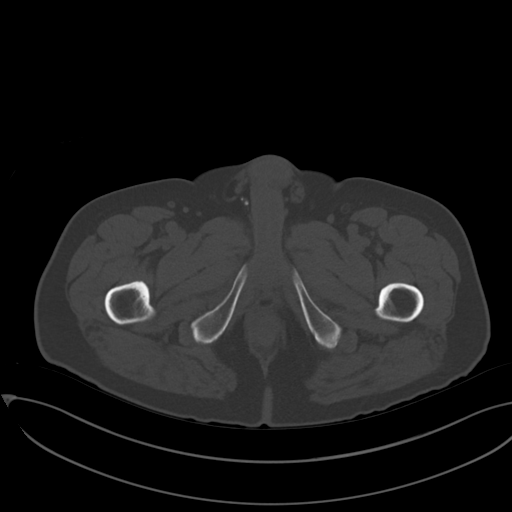
[im 13/97  soft-tissue]
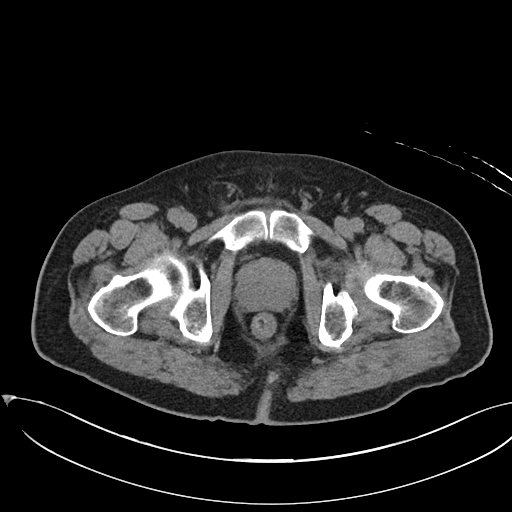
[im 21/97  soft-tissue]
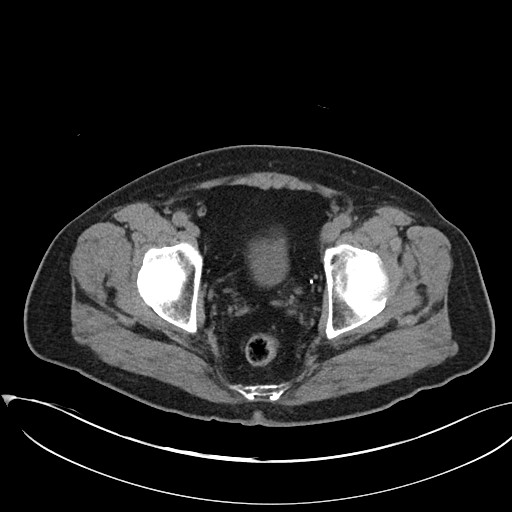
[im 29/97  soft-tissue]
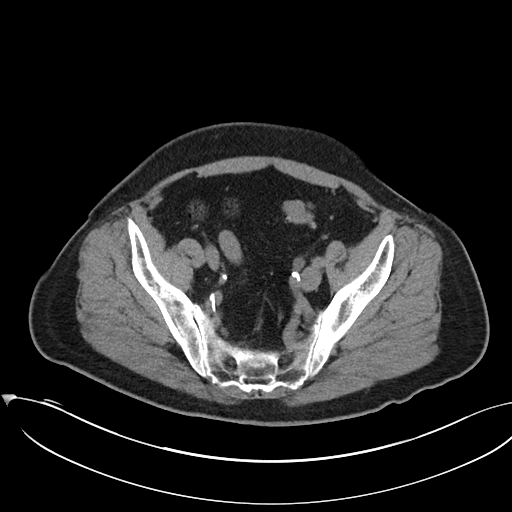
[im 33/97  soft-tissue]
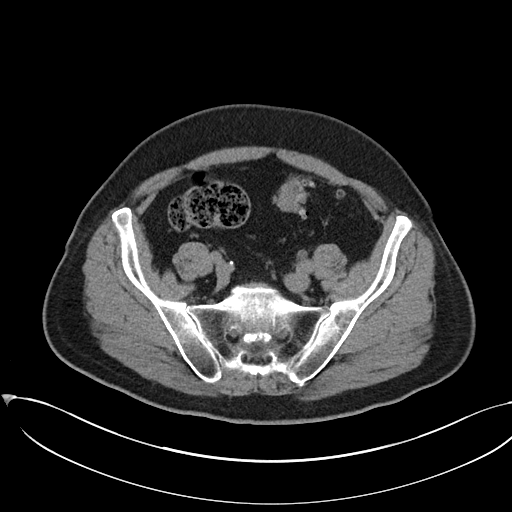
[im 41/97  soft-tissue]
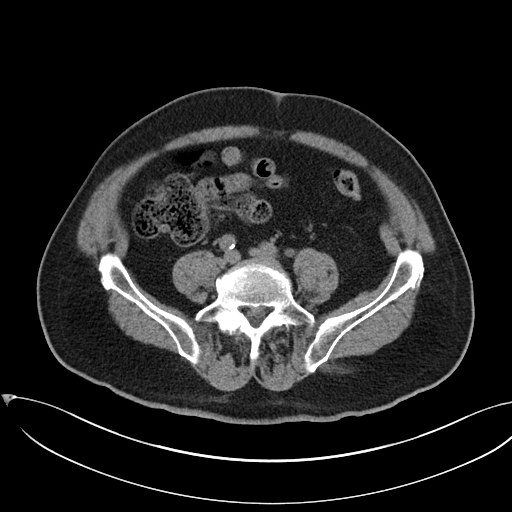
[im 49/97  soft-tissue]
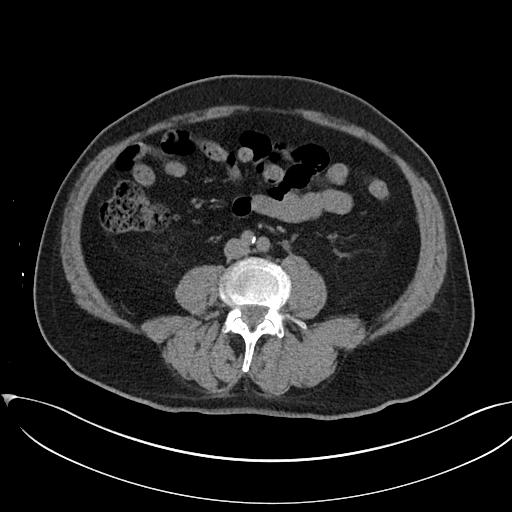
[im 57/97  soft-tissue]
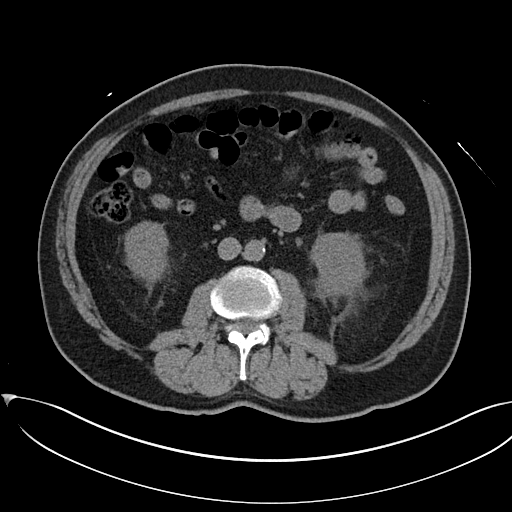
[im 65/97  soft-tissue]
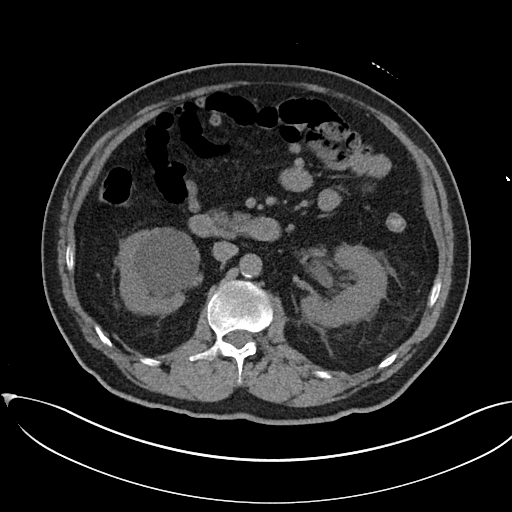
[im 65/97  bone]
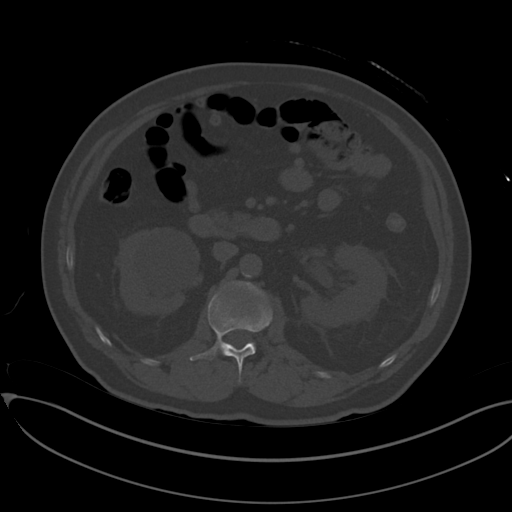
[im 69/97  soft-tissue]
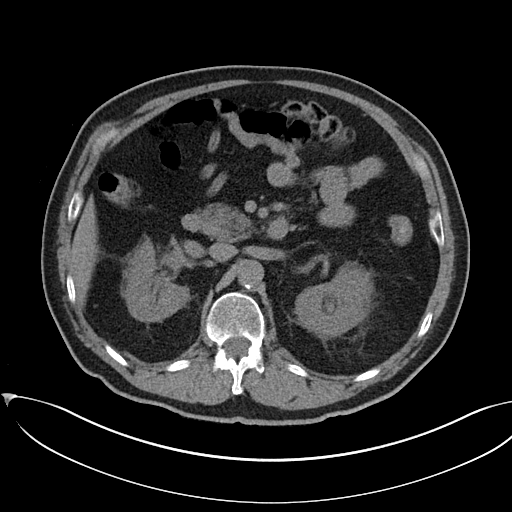
[im 77/97  soft-tissue]
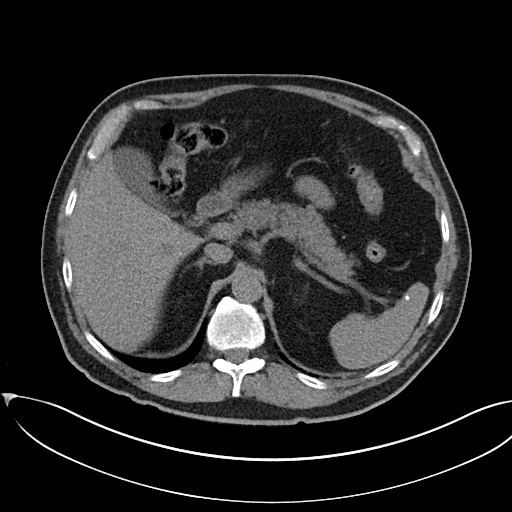
[im 85/97  soft-tissue]
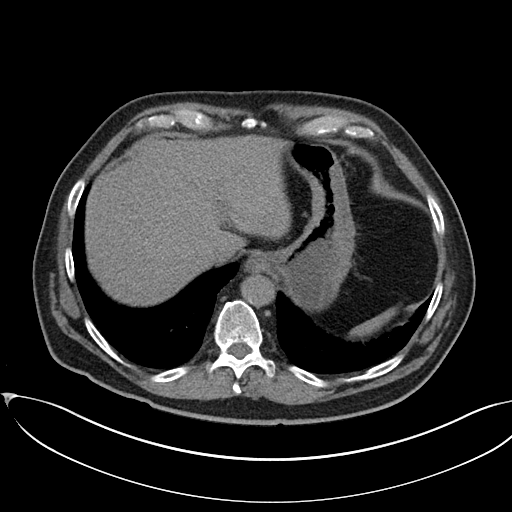
[im 93/97  soft-tissue]
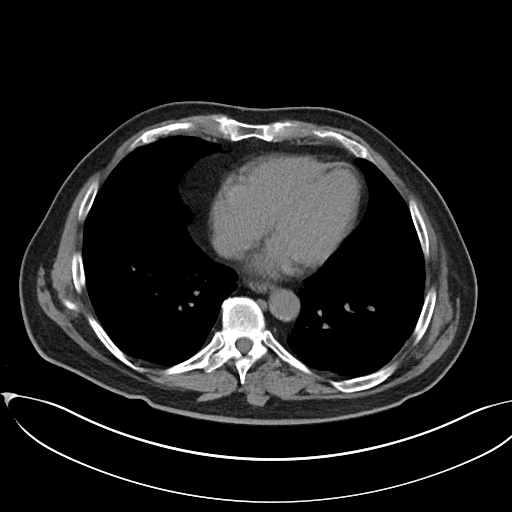

[Series 6: coronal · coronal · 0.74mm/px · 3 of 87 slices shown]
[im 29/87  soft-tissue]
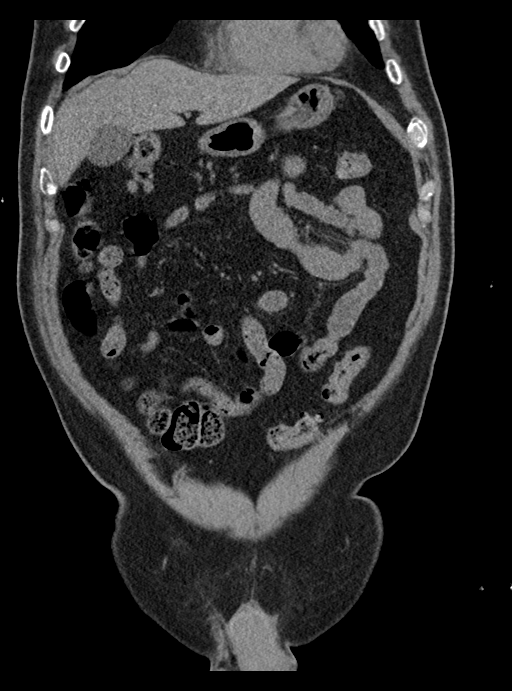
[im 39/87  soft-tissue]
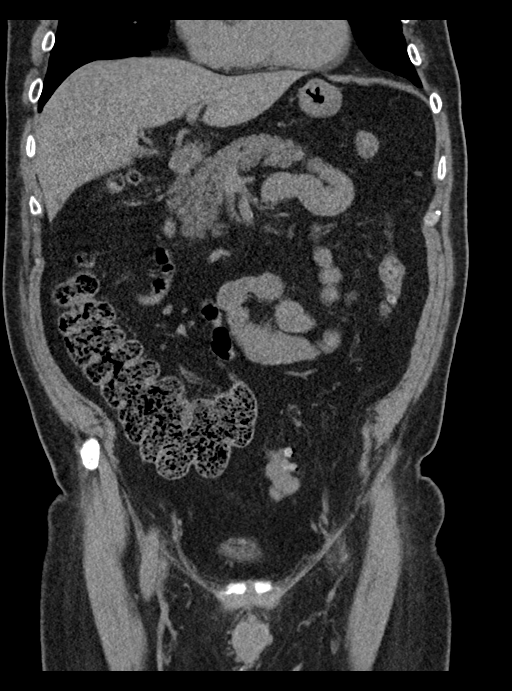
[im 48/87  soft-tissue]
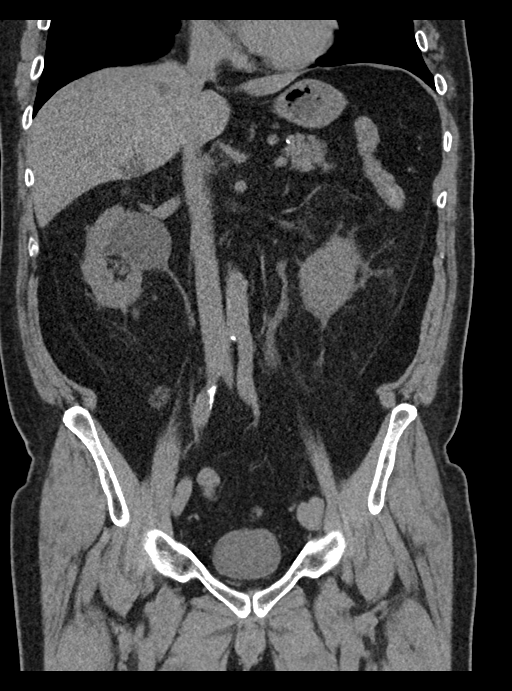

[16 of 46 positions shown; findings below may reference images not displayed]

FINDINGS: Lower chest: Linear atelectasis left base. Minimal calcified plaque
over the descending thoracic aorta.

Hepatobiliary: There are a few small stable liver cysts. Gallbladder
and biliary tree are normal.

Pancreas: Normal.

Spleen: Normal.

Adrenals/Urinary Tract: Adrenal glands are normal. Kidneys are
normal size. 4.8 cm right parapelvic renal cyst unchanged. No
right-sided hydronephrosis. Right ureter is normal. There is mild
left-sided hydronephrosis with mild perinephric inflammation/fluid.
There is a punctate stone over the upper pole of the left kidney.
There is mild prominence of the left ureter as there is a 2 mm stone
at the left UVJ causing this low-grade obstruction. Bladder is
normal.

Stomach/Bowel: Stomach and small bowel are normal. Appendix is
normal. Minimal diverticulosis of the colon most prominent over the
distal descending and sigmoid colon.

Vascular/Lymphatic: Mild calcified plaque over the abdominal aorta
which is normal in caliber. No evidence of adenopathy.

Reproductive: Normal.

Other: No free peritoneal fluid.

Musculoskeletal: Stable T12 compression fracture.
IMPRESSION: 1. Minimal left-sided nephrolithiasis. 2 mm stone at the left UVJ
causing low-grade obstruction.

2. 4.8 cm right peripelvic renal cyst unchanged. Several small liver
cysts unchanged.

3.  Colonic diverticulosis.

4.  Stable T12 compression fracture.

5.  Aortic Atherosclerosis ([id number]).

## 2022-01-18 DIAGNOSIS — M545 Low back pain, unspecified: Secondary | ICD-10-CM | POA: Diagnosis not present

## 2022-01-18 DIAGNOSIS — M542 Cervicalgia: Secondary | ICD-10-CM | POA: Diagnosis not present

## 2022-02-08 DIAGNOSIS — C44219 Basal cell carcinoma of skin of left ear and external auricular canal: Secondary | ICD-10-CM | POA: Diagnosis not present

## 2022-03-16 DIAGNOSIS — M961 Postlaminectomy syndrome, not elsewhere classified: Secondary | ICD-10-CM | POA: Diagnosis not present

## 2022-03-16 DIAGNOSIS — G894 Chronic pain syndrome: Secondary | ICD-10-CM | POA: Diagnosis not present

## 2022-03-25 DIAGNOSIS — L02211 Cutaneous abscess of abdominal wall: Secondary | ICD-10-CM | POA: Diagnosis not present

## 2022-07-09 DIAGNOSIS — L820 Inflamed seborrheic keratosis: Secondary | ICD-10-CM | POA: Diagnosis not present

## 2022-07-09 DIAGNOSIS — L570 Actinic keratosis: Secondary | ICD-10-CM | POA: Diagnosis not present

## 2022-07-09 DIAGNOSIS — D485 Neoplasm of uncertain behavior of skin: Secondary | ICD-10-CM | POA: Diagnosis not present

## 2022-08-14 DIAGNOSIS — N401 Enlarged prostate with lower urinary tract symptoms: Secondary | ICD-10-CM | POA: Diagnosis not present

## 2022-08-14 DIAGNOSIS — I10 Essential (primary) hypertension: Secondary | ICD-10-CM | POA: Diagnosis not present

## 2022-08-14 DIAGNOSIS — E785 Hyperlipidemia, unspecified: Secondary | ICD-10-CM | POA: Diagnosis not present

## 2022-08-14 DIAGNOSIS — J312 Chronic pharyngitis: Secondary | ICD-10-CM | POA: Diagnosis not present

## 2022-08-14 DIAGNOSIS — Z0000 Encounter for general adult medical examination without abnormal findings: Secondary | ICD-10-CM | POA: Diagnosis not present

## 2022-08-14 DIAGNOSIS — I2510 Atherosclerotic heart disease of native coronary artery without angina pectoris: Secondary | ICD-10-CM | POA: Diagnosis not present

## 2022-08-14 DIAGNOSIS — I2101 ST elevation (STEMI) myocardial infarction involving left main coronary artery: Secondary | ICD-10-CM | POA: Diagnosis not present

## 2022-08-14 DIAGNOSIS — R351 Nocturia: Secondary | ICD-10-CM | POA: Diagnosis not present

## 2022-08-14 DIAGNOSIS — Z125 Encounter for screening for malignant neoplasm of prostate: Secondary | ICD-10-CM | POA: Diagnosis not present

## 2022-08-14 DIAGNOSIS — M47816 Spondylosis without myelopathy or radiculopathy, lumbar region: Secondary | ICD-10-CM | POA: Diagnosis not present

## 2022-08-16 DIAGNOSIS — Z823 Family history of stroke: Secondary | ICD-10-CM | POA: Diagnosis not present

## 2022-08-16 DIAGNOSIS — E785 Hyperlipidemia, unspecified: Secondary | ICD-10-CM | POA: Diagnosis not present

## 2022-08-16 DIAGNOSIS — N400 Enlarged prostate without lower urinary tract symptoms: Secondary | ICD-10-CM | POA: Diagnosis not present

## 2022-08-16 DIAGNOSIS — Z7982 Long term (current) use of aspirin: Secondary | ICD-10-CM | POA: Diagnosis not present

## 2022-08-16 DIAGNOSIS — Z87892 Personal history of anaphylaxis: Secondary | ICD-10-CM | POA: Diagnosis not present

## 2022-08-16 DIAGNOSIS — I252 Old myocardial infarction: Secondary | ICD-10-CM | POA: Diagnosis not present

## 2022-08-16 DIAGNOSIS — I2510 Atherosclerotic heart disease of native coronary artery without angina pectoris: Secondary | ICD-10-CM | POA: Diagnosis not present

## 2022-08-16 DIAGNOSIS — N529 Male erectile dysfunction, unspecified: Secondary | ICD-10-CM | POA: Diagnosis not present

## 2022-08-16 DIAGNOSIS — Z881 Allergy status to other antibiotic agents status: Secondary | ICD-10-CM | POA: Diagnosis not present

## 2022-08-16 DIAGNOSIS — G8929 Other chronic pain: Secondary | ICD-10-CM | POA: Diagnosis not present

## 2022-08-16 DIAGNOSIS — Z87891 Personal history of nicotine dependence: Secondary | ICD-10-CM | POA: Diagnosis not present

## 2022-08-16 DIAGNOSIS — I10 Essential (primary) hypertension: Secondary | ICD-10-CM | POA: Diagnosis not present

## 2023-06-25 ENCOUNTER — Other Ambulatory Visit: Payer: Self-pay | Admitting: Specialist

## 2023-06-25 DIAGNOSIS — M5417 Radiculopathy, lumbosacral region: Secondary | ICD-10-CM

## 2023-11-11 ENCOUNTER — Encounter (HOSPITAL_COMMUNITY): Payer: Self-pay

## 2023-11-11 ENCOUNTER — Emergency Department (HOSPITAL_COMMUNITY)
Admission: EM | Admit: 2023-11-11 | Discharge: 2023-11-12 | Discharge status: Against Medical Advice | Payer: PPO | Attending: Emergency Medicine | Admitting: Emergency Medicine

## 2023-11-11 ENCOUNTER — Other Ambulatory Visit: Payer: Self-pay

## 2023-11-11 ENCOUNTER — Emergency Department (HOSPITAL_COMMUNITY): Payer: PPO

## 2023-11-11 DIAGNOSIS — Z5321 Procedure and treatment not carried out due to patient leaving prior to being seen by health care provider: Secondary | ICD-10-CM | POA: Insufficient documentation

## 2023-11-11 DIAGNOSIS — R110 Nausea: Secondary | ICD-10-CM | POA: Insufficient documentation

## 2023-11-11 DIAGNOSIS — R519 Headache, unspecified: Secondary | ICD-10-CM | POA: Insufficient documentation

## 2023-11-11 DIAGNOSIS — R0789 Other chest pain: Secondary | ICD-10-CM | POA: Diagnosis present

## 2023-11-11 LAB — CBC
HCT: 49.7 % (ref 39.0–52.0)
Hemoglobin: 16.2 g/dL (ref 13.0–17.0)
MCH: 30.3 pg (ref 26.0–34.0)
MCHC: 32.6 g/dL (ref 30.0–36.0)
MCV: 93.1 fL (ref 80.0–100.0)
Platelets: 217 10*3/uL (ref 150–400)
RBC: 5.34 MIL/uL (ref 4.22–5.81)
RDW: 13.5 % (ref 11.5–15.5)
WBC: 12.3 10*3/uL — ABNORMAL HIGH (ref 4.0–10.5)
nRBC: 0 % (ref 0.0–0.2)

## 2023-11-11 LAB — BASIC METABOLIC PANEL
Anion gap: 11 (ref 5–15)
BUN: 20 mg/dL (ref 8–23)
CO2: 22 mmol/L (ref 22–32)
Calcium: 9.4 mg/dL (ref 8.9–10.3)
Chloride: 106 mmol/L (ref 98–111)
Creatinine, Ser: 1.24 mg/dL (ref 0.61–1.24)
GFR, Estimated: >60 mL/min (ref >60)
Glucose, Bld: 139 mg/dL — ABNORMAL HIGH (ref 70–99)
Potassium: 4.1 mmol/L (ref 3.5–5.1)
Sodium: 139 mmol/L (ref 135–145)

## 2023-11-11 LAB — TROPONIN I (HIGH SENSITIVITY): Troponin I (High Sensitivity): <2 ng/L (ref <18)

## 2023-11-11 NOTE — ED Provider Triage Note (Signed)
Emergency Medicine Provider Triage Evaluation Note  Patrick Tanner. , a 72 y.o. male  was evaluated in triage.  Pt complains of chest pain starting while he was cooking.  Localized to the center of his chest, felt like a mild sharp pain.  States that it felt nothing like when he had a heart attack many many years ago.  He took 2 sublingual nitroglycerin he had left, as well as 324 mg aspirin.  After taking the nitro he felt very lightheaded, diaphoretic, nauseous.  His chest pain is completely resolved.  States that he was dealing with some palpitations and had seen his PCP about this, had a normal EKG of few months ago.  Did not have any palpitations today..  Review of Systems  Positive: Chest pain, headedness and nausea after nitro Negative: Abdominal pain, vomiting, shortness of breath, leg pain or swelling  Physical Exam  BP 127/71 (BP Location: Right Arm)   Pulse 73   Temp 98.2 F (36.8 C) (Oral)   Resp 15   Ht 5\' 9"  (1.753 m)   Wt 81.6 kg   SpO2 94%   BMI 26.58 kg/m  Gen:   Awake, no distress   Resp:  Normal effort  MSK:   Moves extremities without difficulty  Other:    Medical Decision Making  Medically screening exam initiated at 8:14 PM.  Appropriate orders placed.  Clint Lipps. was informed that the remainder of the evaluation will be completed by another provider, this initial triage assessment does not replace that evaluation, and the importance of remaining in the ED until their evaluation is complete.  Workup initiated   Su Monks, PA-C 11/11/23 2015

## 2023-11-11 NOTE — ED Triage Notes (Signed)
Pt arrived from home via [facility] c/o chest pain. Pt took 2 SL nitroglycerin at the same time at home prior to calling EMS and 324mg  ASA. Pt states that the chest pain was very minor after taking the nitroglycerin pt became lightheaded and diaphoretic. Pt is now chest pain free and all other symptoms have resolved.

## 2023-11-11 NOTE — ED Notes (Signed)
Pt called x 3 , no answer

## 2023-11-11 NOTE — ED Notes (Signed)
Called 2x for 2nd trop no answer
